# Patient Record
Sex: Male | Born: 1985 | Race: Black or African American | Hispanic: No | Marital: Single | Smoking: Current some day smoker
Health system: Southern US, Community
[De-identification: ages and names within clinical notes are randomized; demographics above are authoritative.]

## PROBLEM LIST (undated history)

## (undated) DIAGNOSIS — S0990XS Unspecified injury of head, sequela: Secondary | ICD-10-CM

## (undated) DIAGNOSIS — G44309 Post-traumatic headache, unspecified, not intractable: Secondary | ICD-10-CM

## (undated) HISTORY — PX: TONSILLECTOMY: SUR1361

---

## 2019-12-07 ENCOUNTER — Emergency Department (HOSPITAL_COMMUNITY)

## 2019-12-07 ENCOUNTER — Encounter (HOSPITAL_COMMUNITY): Payer: Self-pay

## 2019-12-07 ENCOUNTER — Other Ambulatory Visit: Payer: Self-pay

## 2019-12-07 ENCOUNTER — Emergency Department (HOSPITAL_COMMUNITY)
Admission: EM | Admit: 2019-12-07 | Discharge: 2019-12-08 | Attending: Emergency Medicine | Admitting: Emergency Medicine

## 2019-12-07 DIAGNOSIS — R0789 Other chest pain: Secondary | ICD-10-CM

## 2019-12-07 DIAGNOSIS — R519 Headache, unspecified: Secondary | ICD-10-CM | POA: Insufficient documentation

## 2019-12-07 DIAGNOSIS — I10 Essential (primary) hypertension: Secondary | ICD-10-CM | POA: Diagnosis present

## 2019-12-07 DIAGNOSIS — R079 Chest pain, unspecified: Secondary | ICD-10-CM | POA: Insufficient documentation

## 2019-12-07 DIAGNOSIS — F17200 Nicotine dependence, unspecified, uncomplicated: Secondary | ICD-10-CM | POA: Insufficient documentation

## 2019-12-07 DIAGNOSIS — R202 Paresthesia of skin: Secondary | ICD-10-CM | POA: Insufficient documentation

## 2019-12-07 HISTORY — DX: Post-traumatic headache, unspecified, not intractable: G44.309

## 2019-12-07 HISTORY — DX: Unspecified injury of head, sequela: S09.90XS

## 2019-12-07 LAB — CBC
HCT: 46.6 % (ref 39.0–52.0)
Hemoglobin: 15.1 g/dL (ref 13.0–17.0)
MCH: 28.8 pg (ref 26.0–34.0)
MCHC: 32.4 g/dL (ref 30.0–36.0)
MCV: 88.8 fL (ref 80.0–100.0)
Platelets: 240 10*3/uL (ref 150–400)
RBC: 5.25 MIL/uL (ref 4.22–5.81)
RDW: 13.7 % (ref 11.5–15.5)
WBC: 4.2 10*3/uL (ref 4.0–10.5)
nRBC: 0 % (ref 0.0–0.2)

## 2019-12-07 LAB — BASIC METABOLIC PANEL
Anion gap: 9 (ref 5–15)
BUN: 15 mg/dL (ref 6–20)
CO2: 28 mmol/L (ref 22–32)
Calcium: 9.7 mg/dL (ref 8.9–10.3)
Chloride: 100 mmol/L (ref 98–111)
Creatinine, Ser: 1 mg/dL (ref 0.61–1.24)
GFR calc Af Amer: >60 mL/min (ref >60)
GFR calc non Af Amer: >60 mL/min (ref >60)
Glucose, Bld: 90 mg/dL (ref 70–99)
Potassium: 3.9 mmol/L (ref 3.5–5.1)
Sodium: 137 mmol/L (ref 135–145)

## 2019-12-07 LAB — TROPONIN I (HIGH SENSITIVITY): Troponin I (High Sensitivity): 2 ng/L (ref <18)

## 2019-12-07 MED ORDER — SODIUM CHLORIDE 0.9 % IV BOLUS
1000.0000 mL | Freq: Once | INTRAVENOUS | Status: AC
  Administered 2019-12-07: 1000 mL via INTRAVENOUS

## 2019-12-07 MED ORDER — METOCLOPRAMIDE HCL 5 MG/ML IJ SOLN
10.0000 mg | Freq: Once | INTRAMUSCULAR | Status: AC
  Administered 2019-12-07: 10 mg via INTRAVENOUS
  Filled 2019-12-07: qty 2

## 2019-12-07 MED ORDER — SODIUM CHLORIDE 0.9% FLUSH
3.0000 mL | Freq: Once | INTRAVENOUS | Status: AC
  Administered 2019-12-07: 3 mL via INTRAVENOUS

## 2019-12-07 MED ORDER — DIPHENHYDRAMINE HCL 50 MG/ML IJ SOLN
25.0000 mg | Freq: Once | INTRAMUSCULAR | Status: AC
  Administered 2019-12-07: 25 mg via INTRAVENOUS
  Filled 2019-12-07: qty 1

## 2019-12-07 MED ORDER — DEXAMETHASONE SODIUM PHOSPHATE 10 MG/ML IJ SOLN
10.0000 mg | Freq: Once | INTRAMUSCULAR | Status: AC
  Administered 2019-12-07: 10 mg via INTRAVENOUS
  Filled 2019-12-07: qty 1

## 2019-12-07 NOTE — ED Triage Notes (Addendum)
[place] [facility] [place].  All inmates have been on covid quarantine. 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 Only med is prn tylenol.  Pt arrives via ems c/o htn and increased headache for a few days and numbness.  161/116 with ems.  20 r ac 1 in ntg  L chest

## 2019-12-07 NOTE — ED Provider Notes (Signed)
[facility] EMERGENCY DEPARTMENT Provider Note   CSN: [id number] Arrival date & time: 12/07/19  2222   History Chief Complaint  Patient presents with  . Hypertension    Cody Miller is a 34 y.o. male.  The history is provided by the patient.  Hypertension  He states that his blood pressure has been elevated for the last week.  For the last 3 days, he has had an intermittent headache at the vertex which is moderate in intensity and he rates it at 4/10.  There has been no associated photophobia or phonophobia.  Since yesterday, he has had intermittent sharp, shooting pains in the left lower chest.  When present, pain is rated at 8/10.  There has been some associated nausea but no dyspnea or diaphoresis.  His chest pain seems to be present most when he wakes up from sleep but does not wake him from sleep.  He is also noted some numbness in his left leg for about the last 3 days.  This numbness is constant.  There has been no back pain and no bowel or bladder dysfunction.  He denies any weakness.  EMS had reported blood pressure of 161/116.  He does smoke about 3 cigarettes a day.  He denies history of hypertension, diabetes, hyperlipidemia.  There is a family history of premature coronary atherosclerosis.  Past Medical History:  Diagnosis Date  . 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   Past Surgical History:  Procedure Laterality Date  . TONSILLECTOMY         History reviewed. No pertinent family history.  Social History   Tobacco Use  . Smoking status: Current Some Day Smoker  . Smokeless tobacco: Never Used  Substance Use Topics  . Alcohol use: Not Currently  . Drug use: Not Currently    Home Medications Prior to Admission medications   Not on File    Allergies    Patient has no allergy information on record.  Review of Systems   Review of Systems  All other systems reviewed and are negative.   Physical  Exam Updated Vital Signs BP 135/89   Pulse 78   Temp 98.4 F (36.9 C) (Oral)   Resp 16   Ht 6\' 7"  (2.007 m)   Wt 113.4 kg   SpO2 99%   BMI 28.16 kg/m   Physical Exam Vitals and nursing note reviewed.   34 year old male, resting comfortably and in no acute distress. Vital signs are normal. Oxygen saturation is 99%, which is normal. Head is normocephalic and atraumatic. PERRLA, EOMI. Oropharynx is clear. Neck is nontender and supple without adenopathy or JVD. Back is nontender and there is no CVA tenderness.  Straight leg raise is negative. Lungs are clear without rales, wheezes, or rhonchi. Chest is nontender. Heart has regular rate and rhythm without murmur. Abdomen is soft, flat, nontender without masses or hepatosplenomegaly and peristalsis is normoactive. Extremities have no cyanosis or edema, full range of motion is present. Skin is warm and dry without rash. Neurologic: Mental status is normal, cranial nerves are intact, there are no motor deficits.  There is no pronator drift.  There is decreased pinprick sensation on the left leg compared with the right with normal sensation beginning at the inguinal fold.  There is no extinction on double simultaneous stimulation.  ED Results / Procedures / Treatments   Labs (all labs ordered are listed, but only abnormal  results are displayed) Labs Reviewed  BASIC METABOLIC PANEL  CBC  TROPONIN I (HIGH SENSITIVITY)  TROPONIN I (HIGH SENSITIVITY)    EKG EKG Interpretation  Date/Time:  Friday December 07 2019 22:36:46 EST Ventricular Rate:  82 PR Interval:    QRS Duration: 103 QT Interval:  357 QTC Calculation: 417 R Axis:   35 Text Interpretation: Sinus rhythm Consider left atrial enlargement T wave inversion Inferior leads No old tracing to compare Confirmed by Dione Booze ([id number]) on 12/07/2019 10:59:21 PM   Radiology DG Chest 2 View  Result Date: 12/07/2019 CLINICAL DATA:  Hypertension and chest pain EXAM: CHEST - 2 VIEW  COMPARISON:  None. FINDINGS: The heart size and mediastinal contours are within normal limits. Both lungs are clear. The visualized skeletal structures are unremarkable. IMPRESSION: No active cardiopulmonary disease. Electronically Signed   By: Jasmine Pang M.D.   On: 12/07/2019 23:32    Procedures Procedures  Medications Ordered in ED Medications  insulin aspart (novoLOG) injection 10 Units (10 Units Intravenous Not Given 12/08/19 0014)  sodium chloride flush (NS) 0.9 % injection 3 mL (3 mLs Intravenous Given by Other 12/07/19 2248)  sodium chloride 0.9 % bolus 1,000 mL (0 mLs Intravenous Stopped 12/08/19 0049)  metoCLOPramide (REGLAN) injection 10 mg (10 mg Intravenous Given 12/07/19 2344)  diphenhydrAMINE (BENADRYL) injection 25 mg (25 mg Intravenous Given 12/07/19 2343)  dexamethasone (DECADRON) injection 10 mg (10 mg Intravenous Given 12/07/19 2343)    ED Course  I have reviewed the triage vital signs and the nursing notes.  Pertinent labs & imaging results that were available during my care of the patient were reviewed by me and considered in my medical decision making (see chart for details).  MDM Rules/Calculators/A&P Headache which seems benign.  Report of elevated blood pressure but blood pressure is normal in the ED.  Atypical chest pain with low risk of cardiac etiology.  Heart score is 1 which puts him at very low risk for major adverse cardiac events in the next 6 weeks.  Numbness of the left leg of uncertain cause.  No evidence of radiculopathy and no sign of stroke.  He has no prior records in the [facility] health system.  ECG shows no acute changes.  Screening labs are obtained as well as chest x-ray.  We will also give a headache cocktail of normal saline, metoclopramide, diphenhydramine and dexamethasone and reassess.  Labs are unremarkable including troponin x2.  Headache is completely resolved following above-noted treatment.  Paresthesias also improved dramatically suggesting  possible complex migraine.  He is discharged with instructions to monitor his blood pressure as an outpatient.  Final Clinical Impression(s) / ED Diagnoses Final diagnoses:  Atypical chest pain  Bad headache  Paresthesia of left leg    Rx / DC Orders ED Discharge Orders    None       Dione Booze, MD 12/08/19 [phone]

## 2019-12-08 DIAGNOSIS — R079 Chest pain, unspecified: Secondary | ICD-10-CM | POA: Diagnosis not present

## 2019-12-08 LAB — TROPONIN I (HIGH SENSITIVITY): Troponin I (High Sensitivity): 3 ng/L (ref <18)

## 2019-12-08 MED ORDER — SODIUM CHLORIDE 0.9 % IV BOLUS: 1000.0000 mL | Freq: Once | INTRAVENOUS | Status: DC

## 2019-12-08 MED ORDER — INSULIN ASPART 100 UNIT/ML IV SOLN: 10.0000 [IU] | Freq: Once | INTRAVENOUS | Status: DC

## 2019-12-08 MED ORDER — METFORMIN HCL 500 MG PO TABS: 500.0000 mg | ORAL_TABLET | Freq: Once | ORAL | Status: DC

## 2019-12-08 NOTE — Discharge Instructions (Signed)
Your evaluation did not show any evidence of any serious disease.    Your blood pressure was normal while you are in the emergency department, but should continue to be followed as an outpatient.    Return if symptoms are getting worse.

## 2021-05-28 IMAGING — DX DG CHEST 2V
2 series · 2 of 2 positions shown · non-contrast
Comparison: None.

CLINICAL DATA: Hypertension and chest pain

EXAM:
CHEST - 2 VIEW

[chest pa]
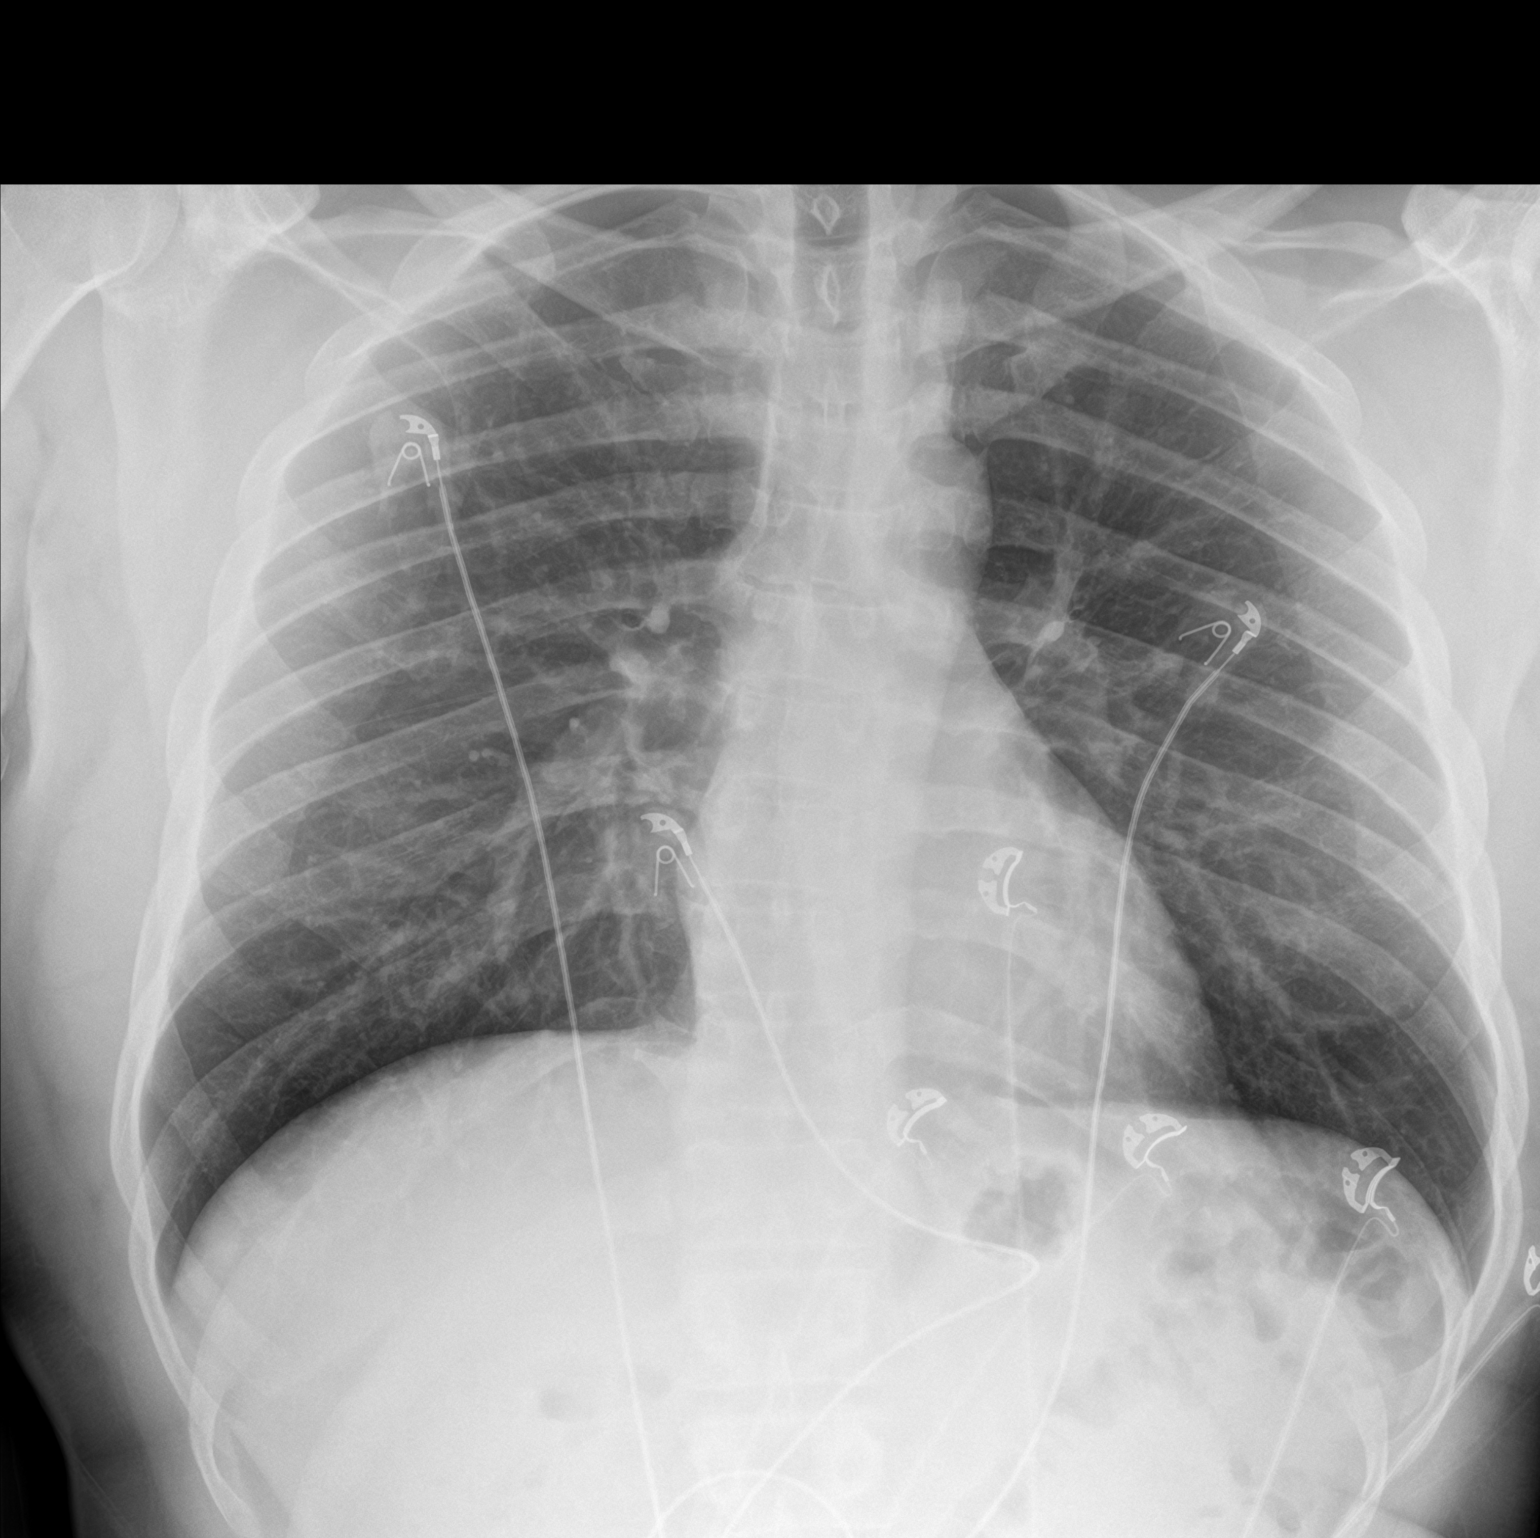

[chest lat]
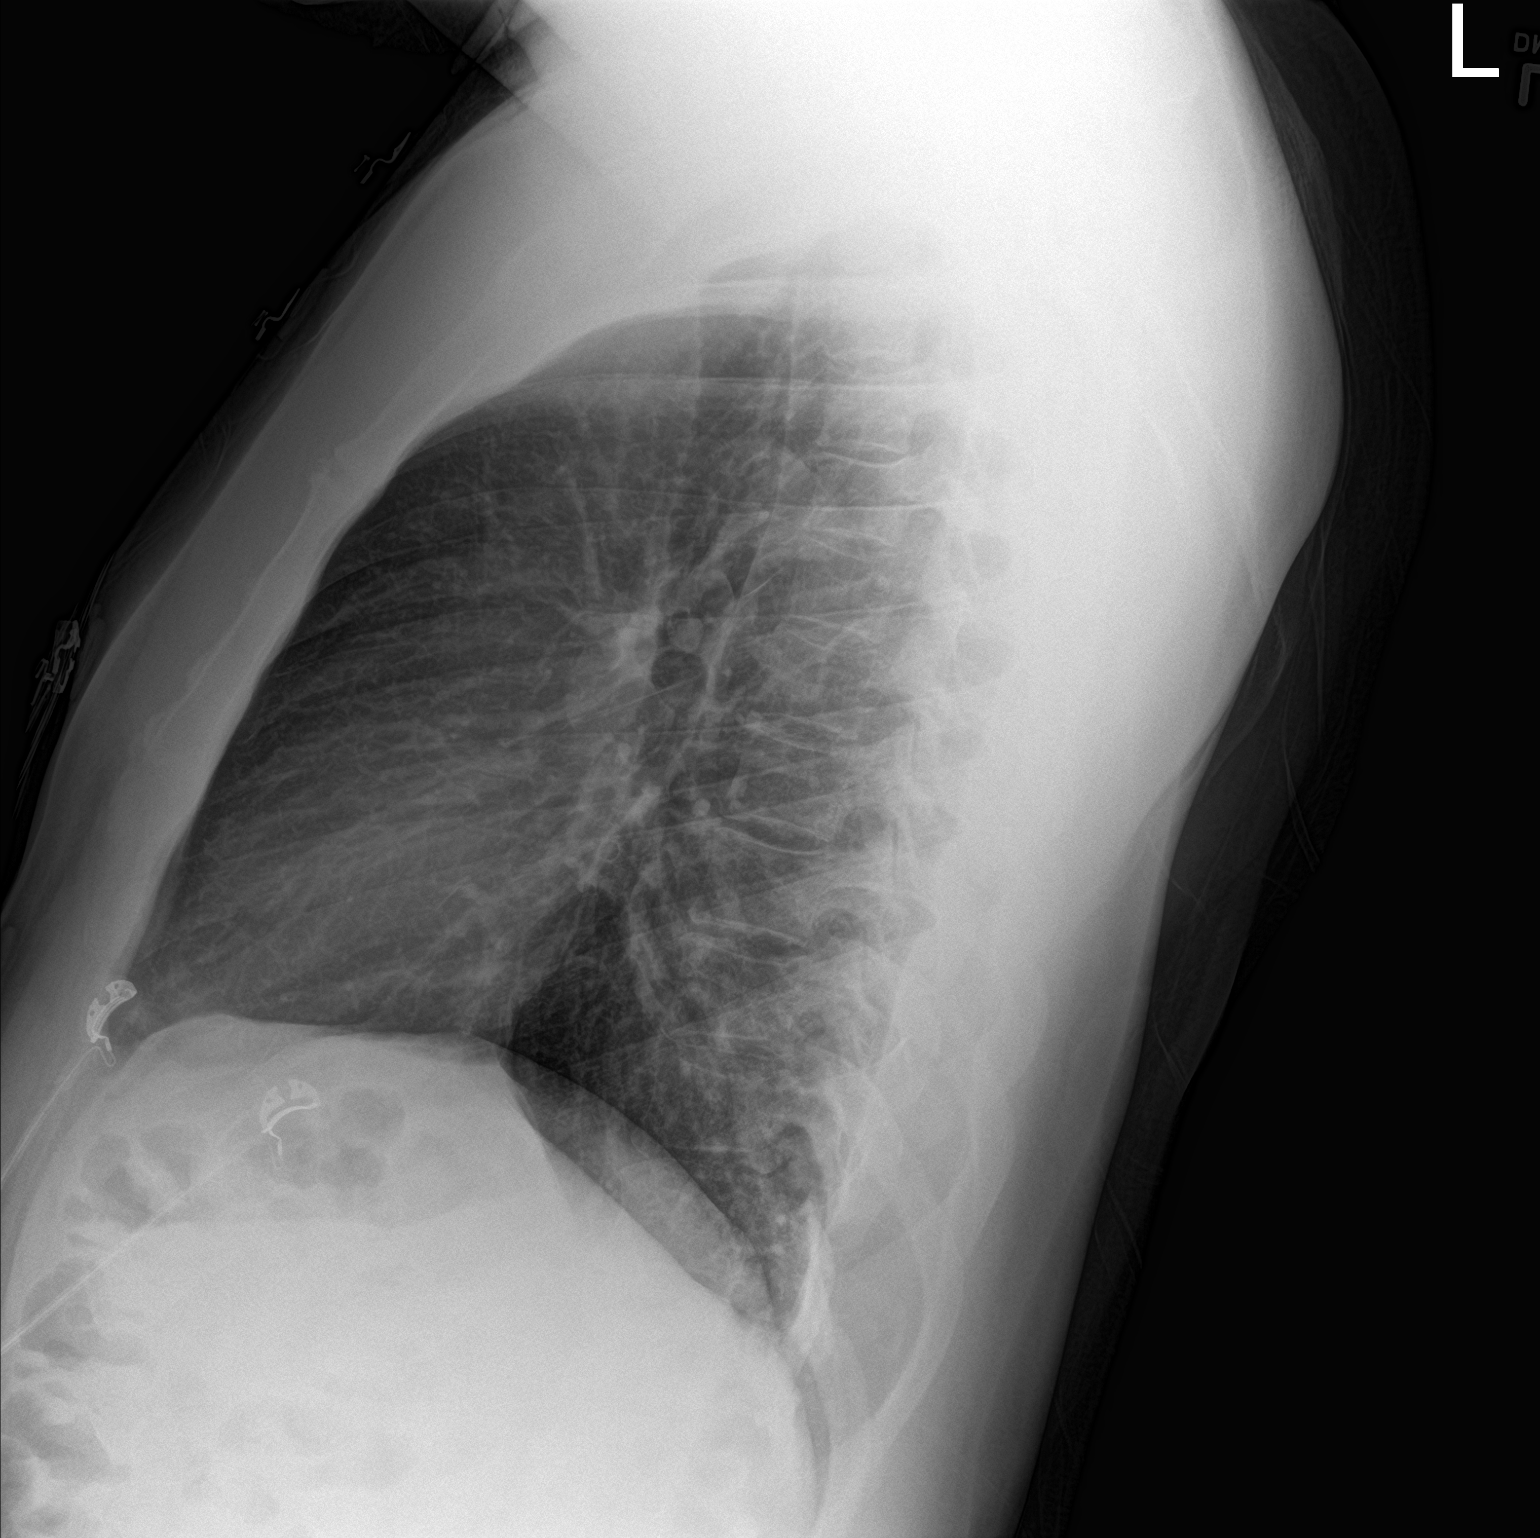

[2 of 2 positions shown; findings below may reference images not displayed]

FINDINGS: The heart size and mediastinal contours are within normal limits.
Both lungs are clear. The visualized skeletal structures are
unremarkable.
IMPRESSION: No active cardiopulmonary disease.
